# Patient Record
Sex: Female | Born: 1985 | Hispanic: Yes | Marital: Single | Smoking: Current every day smoker
Health system: Southern US, Community
[De-identification: ages and names within clinical notes are randomized; demographics above are authoritative.]

## PROBLEM LIST (undated history)

## (undated) DIAGNOSIS — I10 Essential (primary) hypertension: Secondary | ICD-10-CM

## (undated) DIAGNOSIS — E119 Type 2 diabetes mellitus without complications: Secondary | ICD-10-CM

---

## 2014-05-08 ENCOUNTER — Other Ambulatory Visit (HOSPITAL_COMMUNITY)
Admission: RE | Admit: 2014-05-08 | Discharge: 2014-05-08 | Disposition: A | Discharge status: Home / Self Care | Payer: Self-pay | Source: Ambulatory Visit | Attending: Unknown Physician Specialty | Admitting: Unknown Physician Specialty

## 2014-05-08 ENCOUNTER — Other Ambulatory Visit: Payer: Self-pay | Admitting: Nurse Practitioner

## 2014-05-08 DIAGNOSIS — R87612 Low grade squamous intraepithelial lesion on cytologic smear of cervix (LGSIL): Secondary | ICD-10-CM | POA: Insufficient documentation

## 2014-05-08 DIAGNOSIS — N871 Moderate cervical dysplasia: Secondary | ICD-10-CM | POA: Insufficient documentation

## 2014-05-14 LAB — CYTOLOGY - PAP

## 2014-11-22 ENCOUNTER — Ambulatory Visit: Payer: Self-pay | Admitting: Orthopedic Surgery

## 2016-05-25 ENCOUNTER — Encounter (HOSPITAL_COMMUNITY)
Admission: EM | Disposition: A | Discharge status: Home / Self Care | Payer: Self-pay | Source: Home / Self Care | Attending: Emergency Medicine

## 2016-05-25 ENCOUNTER — Observation Stay (HOSPITAL_COMMUNITY)
Admission: EM | Admit: 2016-05-25 | Discharge: 2016-05-26 | Disposition: A | Discharge status: Home / Self Care | Payer: Self-pay | Attending: Orthopaedic Surgery | Admitting: Orthopaedic Surgery

## 2016-05-25 ENCOUNTER — Emergency Department (HOSPITAL_COMMUNITY): Payer: Self-pay

## 2016-05-25 ENCOUNTER — Encounter (HOSPITAL_COMMUNITY): Payer: Self-pay | Admitting: Emergency Medicine

## 2016-05-25 DIAGNOSIS — W28XXXA Contact with powered lawn mower, initial encounter: Secondary | ICD-10-CM | POA: Insufficient documentation

## 2016-05-25 DIAGNOSIS — S92411B Displaced fracture of proximal phalanx of right great toe, initial encounter for open fracture: Principal | ICD-10-CM | POA: Insufficient documentation

## 2016-05-25 DIAGNOSIS — Z23 Encounter for immunization: Secondary | ICD-10-CM | POA: Insufficient documentation

## 2016-05-25 DIAGNOSIS — S91319A Laceration without foreign body, unspecified foot, initial encounter: Secondary | ICD-10-CM | POA: Diagnosis present

## 2016-05-25 DIAGNOSIS — Y93H2 Activity, gardening and landscaping: Secondary | ICD-10-CM | POA: Insufficient documentation

## 2016-05-25 DIAGNOSIS — S92421B Displaced fracture of distal phalanx of right great toe, initial encounter for open fracture: Secondary | ICD-10-CM | POA: Insufficient documentation

## 2016-05-25 DIAGNOSIS — S92911B Unspecified fracture of right toe(s), initial encounter for open fracture: Secondary | ICD-10-CM

## 2016-05-25 DIAGNOSIS — S91311A Laceration without foreign body, right foot, initial encounter: Secondary | ICD-10-CM

## 2016-05-25 DIAGNOSIS — E119 Type 2 diabetes mellitus without complications: Secondary | ICD-10-CM | POA: Insufficient documentation

## 2016-05-25 DIAGNOSIS — I10 Essential (primary) hypertension: Secondary | ICD-10-CM | POA: Insufficient documentation

## 2016-05-25 HISTORY — DX: Type 2 diabetes mellitus without complications: E11.9

## 2016-05-25 HISTORY — PX: I & D EXTREMITY: SHX5045

## 2016-05-25 HISTORY — DX: Essential (primary) hypertension: I10

## 2016-05-25 HISTORY — PX: IRRIGATION AND DEBRIDEMENT FOOT: SHX6602

## 2016-05-25 LAB — BASIC METABOLIC PANEL
ANION GAP: 8 (ref 5–15)
BUN: 8 mg/dL (ref 6–20)
CHLORIDE: 111 mmol/L (ref 101–111)
CO2: 19 mmol/L — AB (ref 22–32)
Calcium: 9.1 mg/dL (ref 8.9–10.3)
Creatinine, Ser: 0.49 mg/dL (ref 0.44–1.00)
GFR calc Af Amer: >60 mL/min (ref >60)
GLUCOSE: 121 mg/dL — AB (ref 65–99)
POTASSIUM: 3.8 mmol/L (ref 3.5–5.1)
Sodium: 138 mmol/L (ref 135–145)

## 2016-05-25 LAB — CBC WITH DIFFERENTIAL/PLATELET
BASOS ABS: 0 10*3/uL (ref 0.0–0.1)
BASOS PCT: 0 %
EOS ABS: 0 10*3/uL (ref 0.0–0.7)
EOS PCT: 0 %
HCT: 35.3 % — ABNORMAL LOW (ref 36.0–46.0)
HEMOGLOBIN: 12 g/dL (ref 12.0–15.0)
Lymphocytes Relative: 11 %
Lymphs Abs: 1 10*3/uL (ref 0.7–4.0)
MCH: 32.9 pg (ref 26.0–34.0)
MCHC: 34 g/dL (ref 30.0–36.0)
MCV: 96.7 fL (ref 78.0–100.0)
Monocytes Absolute: 0.6 10*3/uL (ref 0.1–1.0)
Monocytes Relative: 7 %
NEUTROS PCT: 82 %
Neutro Abs: 7.3 10*3/uL (ref 1.7–7.7)
PLATELETS: 192 10*3/uL (ref 150–400)
RBC: 3.65 MIL/uL — AB (ref 3.87–5.11)
RDW: 13.2 % (ref 11.5–15.5)
WBC: 8.9 10*3/uL (ref 4.0–10.5)

## 2016-05-25 LAB — CBG MONITORING, ED: Glucose-Capillary: 122 mg/dL — ABNORMAL HIGH (ref 65–99)

## 2016-05-25 LAB — I-STAT BETA HCG BLOOD, ED (MC, WL, AP ONLY): I-stat hCG, quantitative: <5 m[IU]/mL (ref <5)

## 2016-05-25 SURGERY — IRRIGATION AND DEBRIDEMENT EXTREMITY
Anesthesia: General | Site: Foot | Laterality: Right

## 2016-05-25 MED ORDER — FENTANYL CITRATE (PF) 100 MCG/2ML IJ SOLN
50.0000 ug | Freq: Once | INTRAMUSCULAR | Status: AC
  Administered 2016-05-25: 50 ug via INTRAVENOUS
  Filled 2016-05-25: qty 2

## 2016-05-25 MED ORDER — SODIUM CHLORIDE 0.9 % IJ SOLN
INTRAMUSCULAR | Status: AC
  Filled 2016-05-25: qty 10

## 2016-05-25 MED ORDER — SUCCINYLCHOLINE CHLORIDE 200 MG/10ML IV SOSY
PREFILLED_SYRINGE | INTRAVENOUS | Status: AC
  Filled 2016-05-25: qty 10

## 2016-05-25 MED ORDER — PROPOFOL 10 MG/ML IV BOLUS
INTRAVENOUS | Status: AC
  Filled 2016-05-25: qty 20

## 2016-05-25 MED ORDER — MIDAZOLAM HCL 2 MG/2ML IJ SOLN
INTRAMUSCULAR | Status: AC
  Filled 2016-05-25: qty 2

## 2016-05-25 MED ORDER — ROCURONIUM BROMIDE 10 MG/ML (PF) SYRINGE
PREFILLED_SYRINGE | INTRAVENOUS | Status: AC
Start: 2016-05-25 — End: 2016-05-25
  Filled 2016-05-25: qty 10

## 2016-05-25 MED ORDER — ONDANSETRON HCL 4 MG/2ML IJ SOLN
INTRAMUSCULAR | Status: AC
Start: 2016-05-25 — End: 2016-05-25
  Filled 2016-05-25: qty 2

## 2016-05-25 MED ORDER — LACTATED RINGERS IV SOLN
INTRAVENOUS | Status: DC | PRN
  Administered 2016-05-25 – 2016-05-26 (×2): via INTRAVENOUS

## 2016-05-25 MED ORDER — LIDOCAINE 2% (20 MG/ML) 5 ML SYRINGE
INTRAMUSCULAR | Status: AC
  Filled 2016-05-25: qty 5

## 2016-05-25 MED ORDER — TETANUS-DIPHTH-ACELL PERTUSSIS 5-2.5-18.5 LF-MCG/0.5 IM SUSP
0.5000 mL | Freq: Once | INTRAMUSCULAR | Status: AC
  Administered 2016-05-25: 0.5 mL via INTRAMUSCULAR
  Filled 2016-05-25: qty 0.5

## 2016-05-25 MED ORDER — SUFENTANIL CITRATE 50 MCG/ML IV SOLN
INTRAVENOUS | Status: AC
  Filled 2016-05-25: qty 1

## 2016-05-25 SURGICAL SUPPLY — 36 items
BAG DECANTER FOR FLEXI CONT (MISCELLANEOUS) IMPLANT
BANDAGE ACE 4X5 VEL STRL LF (GAUZE/BANDAGES/DRESSINGS) ×3 IMPLANT
BNDG COHESIVE 4X5 TAN STRL (GAUZE/BANDAGES/DRESSINGS) ×3 IMPLANT
BNDG GAUZE ELAST 4 BULKY (GAUZE/BANDAGES/DRESSINGS) ×3 IMPLANT
COVER SURGICAL LIGHT HANDLE (MISCELLANEOUS) ×3 IMPLANT
CUFF TOURNIQUET SINGLE 24IN (TOURNIQUET CUFF) ×3 IMPLANT
DRSG PAD ABDOMINAL 8X10 ST (GAUZE/BANDAGES/DRESSINGS) ×6 IMPLANT
ELECT REM PT RETURN 9FT ADLT (ELECTROSURGICAL)
ELECTRODE REM PT RTRN 9FT ADLT (ELECTROSURGICAL) IMPLANT
GAUZE SPONGE 4X4 12PLY STRL (GAUZE/BANDAGES/DRESSINGS) ×3 IMPLANT
GAUZE XEROFORM 5X9 LF (GAUZE/BANDAGES/DRESSINGS) ×3 IMPLANT
GLOVE BIOGEL PI IND STRL 8 (GLOVE) ×1 IMPLANT
GLOVE BIOGEL PI INDICATOR 8 (GLOVE) ×2
GLOVE ORTHO TXT STRL SZ7.5 (GLOVE) ×3 IMPLANT
GOWN STRL REUS W/ TWL LRG LVL3 (GOWN DISPOSABLE) ×1 IMPLANT
GOWN STRL REUS W/ TWL XL LVL3 (GOWN DISPOSABLE) ×1 IMPLANT
GOWN STRL REUS W/TWL 2XL LVL3 (GOWN DISPOSABLE) IMPLANT
GOWN STRL REUS W/TWL LRG LVL3 (GOWN DISPOSABLE) ×2
GOWN STRL REUS W/TWL XL LVL3 (GOWN DISPOSABLE) ×2
HANDPIECE INTERPULSE COAX TIP (DISPOSABLE)
KIT BASIN OR (CUSTOM PROCEDURE TRAY) ×3 IMPLANT
KIT ROOM TURNOVER OR (KITS) ×3 IMPLANT
MANIFOLD NEPTUNE II (INSTRUMENTS) ×3 IMPLANT
NS IRRIG 1000ML POUR BTL (IV SOLUTION) ×3 IMPLANT
PACK ORTHO EXTREMITY (CUSTOM PROCEDURE TRAY) ×3 IMPLANT
PAD ARMBOARD 7.5X6 YLW CONV (MISCELLANEOUS) ×6 IMPLANT
SET HNDPC FAN SPRY TIP SCT (DISPOSABLE) IMPLANT
SPONGE GAUZE 4X4 12PLY STER LF (GAUZE/BANDAGES/DRESSINGS) ×3 IMPLANT
SPONGE LAP 18X18 X RAY DECT (DISPOSABLE) ×3 IMPLANT
SUT ETHILON 2 0 FS 18 (SUTURE) ×6 IMPLANT
TOWEL OR 17X24 6PK STRL BLUE (TOWEL DISPOSABLE) ×3 IMPLANT
TOWEL OR 17X26 10 PK STRL BLUE (TOWEL DISPOSABLE) ×3 IMPLANT
TUBE CONNECTING 12'X1/4 (SUCTIONS) ×1
TUBE CONNECTING 12X1/4 (SUCTIONS) ×2 IMPLANT
UNDERPAD 30X30 (UNDERPADS AND DIAPERS) ×3 IMPLANT
YANKAUER SUCT BULB TIP NO VENT (SUCTIONS) ×3 IMPLANT

## 2016-05-25 NOTE — ED Notes (Signed)
Pt to OR at this time.

## 2016-05-25 NOTE — ED Triage Notes (Signed)
In EMS after lawnmower vs foot. Pt has deep laceration to RLE. Pulses present, sensation intact. Given 100mcg fentanyl, 4mg  zofran, 1g ancef.

## 2016-05-25 NOTE — Interval H&P Note (Signed)
History and Physical Interval Note:  05/25/2016 11:36 PM  Renee Cohen  has presented today for surgery, with the diagnosis of Right foot trauma  The various methods of treatment have been discussed with the patient and family. After consideration of risks, benefits and other options for treatment, the patient has consented to  Procedure(s): IRRIGATION AND DEBRIDEMENT EXTREMITY (Right) as a surgical intervention .  The patient's history has been reviewed, patient examined, no change in status, stable for surgery.  I have reviewed the patient's chart and labs.  Questions were answered to the patient's satisfaction.     Eldred MangesMark C Maanasa Aderhold

## 2016-05-25 NOTE — H&P (Signed)
Renee Cohen is an 30 y.o. female.   Chief Complaint: barefoot right foot lawnmower injury fx's great toe HPI: past Hx of foot injury age 47 with skin lacerations. On riding lawnmower turning and foot slipped off.  Past Medical History:  Diagnosis Date  . Diabetes mellitus without complication (HCC)   . Hypertension     History reviewed. No pertinent surgical history.  No family history on file. Social History:  has no tobacco, alcohol, and drug history on file.  Allergies:  Allergies  Allergen Reactions  . Oxycodone Itching and Rash     (Not in a hospital admission)  Results for orders placed or performed during the hospital encounter of 05/25/16 (from the past 48 hour(s))  I-Stat Beta hCG blood, ED ([facility], [facility], [facility] only)     Status: None   Collection Time: 05/25/16  9:38 PM  Result Value Ref Range   I-stat hCG, quantitative <5.0 <5 mIU/mL   Comment 3            Comment:   GEST. AGE      CONC.  (mIU/mL)   <=1 WEEK        5 - 50     2 WEEKS       50 - 500     3 WEEKS       100 - 10,000     4 WEEKS     1,000 - 30,000        FEMALE AND NON-PREGNANT FEMALE:     LESS THAN 5 mIU/mL    Dg Foot Complete Right  Result Date: 05/25/2016 CLINICAL DATA:  Right foot slipped and hit blades while mowing lawn. Initial encounter. EXAM: RIGHT FOOT COMPLETE - 3+ VIEW COMPARISON:  None. FINDINGS: There appear to be minimally displaced fractures involving the medial base of the first distal phalanx, and the proximal aspect of the first proximal phalanx, with overlying soft tissue disruption. Minimal scattered high-density debris is noted tracking about the patient's soft tissue wound. Visualized joint spaces are otherwise preserved. The subtalar joint is grossly unremarkable. IMPRESSION: Minimally displaced fractures involving the medial base of the first distal phalanx, and the proximal aspect of the first proximal phalanx. Minimal high-density debris noted tracking about the patient's soft  tissue wound. Electronically Signed   By: Roanna Raider M.D.   On: 05/25/2016 23:09    Review of Systems  Constitutional: Negative.   HENT: Negative.   Respiratory:       Smokes one half pack per day  Skin: Negative.   Endo/Heme/Allergies:       Borderline diabetes    Blood pressure (!) 117/42, pulse 81, temperature 97.7 F (36.5 C), temperature source Oral, resp. rate 22, height 5\' 7"  (1.702 m), weight 47.6 kg (105 lb), SpO2 100 %. Physical Exam  Constitutional: She is oriented to person, place, and time. She appears well-developed and well-nourished.  HENT:  Head: Normocephalic.  Eyes: Pupils are equal, round, and reactive to light.  Neck: Normal range of motion.  Cardiovascular: Normal rate and regular rhythm.   Respiratory: Effort normal.  GI: Soft.  Musculoskeletal:  7cm plantar laceration cannot flex great toe ? Pain good cap refill to great toe.has sensation to both sides of great toe  Neurological: She is alert and oriented to person, place, and time.  Psychiatric: She has a normal mood and affect. Her behavior is normal.     Assessment/Plan Right foot dirty laceration with dirt and grass ground in subQ exposed  and some plantar fascia, possible tendon laceration. To OR for exploration , wash out and repair as needed  Eldred MangesMark C Saloma Cadena, MD 05/25/2016, 11:24 PM

## 2016-05-25 NOTE — ED Notes (Signed)
OR consent signed after procedure explained to pt by Dr. Ophelia CharterYates.

## 2016-05-25 NOTE — ED Notes (Signed)
Dr. Ophelia CharterYates at bedside at this time.  All clothes removed and pt placed in gown.  Shirt, Bra, Shorts, Black [employer] architectcell Phone and charger given to pt's friend.

## 2016-05-25 NOTE — ED Provider Notes (Signed)
[facility]ENCY DEPT Provider Note   CSN: [id number] Arrival date & time: 05/25/16  2028     History   Chief Complaint Chief Complaint  Patient presents with  . Foot Injury    HPI Renee Cohen is a 30 y.o. female.  HPI Patient presents with injury to the right foot after she was struck by a lawnmower blade. Unknown last tetanus. Given Ancef 1 g and fentanyl in route. Patient denies any numbness to the foot. Denies any other injury. Per EMS wound was grossly contaminated. Dressing applied. Past Medical History:  Diagnosis Date  . Diabetes mellitus without complication (HCC)   . Hypertension     There are no active problems to display for this patient.   History reviewed. No pertinent surgical history.  OB History    No data available       Home Medications    Prior to Admission medications   Not on File    Family History No family history on file.  Social History Social History  Substance Use Topics  . Smoking status: Not on file  . Smokeless tobacco: Not on file  . Alcohol use Not on file     Allergies   Review of patient's allergies indicates no known allergies.   Review of Systems Review of Systems  Constitutional: Negative for chills and fever.  Respiratory: Negative for shortness of breath.   Cardiovascular: Negative for chest pain.  Gastrointestinal: Negative for abdominal pain, nausea and vomiting.  Musculoskeletal: Positive for arthralgias. Negative for back pain and neck pain.  Neurological: Negative for dizziness, weakness, numbness and headaches.  All other systems reviewed and are negative.    Physical Exam Updated Vital Signs BP (!) 117/42 (BP Location: Right Arm)   Pulse 81   Temp 97.7 F (36.5 C) (Oral)   Resp 22   Ht 5\' 7"  (1.702 m)   Wt 105 lb (47.6 kg)   LMP  (LMP Unknown)   SpO2 100%   BMI 16.45 kg/m   Physical Exam  Constitutional: She is oriented to person, place, and time. She appears well-developed and  well-nourished.  HENT:  Head: Normocephalic and atraumatic.  Mouth/Throat: Oropharynx is clear and moist.  Eyes: EOM are normal. Pupils are equal, round, and reactive to light.  Neck: Normal range of motion. Neck supple.  Cardiovascular: Normal rate and regular rhythm.   Pulmonary/Chest: Effort normal and breath sounds normal.  Abdominal: Soft. Bowel sounds are normal. There is no tenderness. There is no rebound and no guarding.  Musculoskeletal: Normal range of motion. She exhibits tenderness. She exhibits no edema.  Patient with a 8 cm laceration to the plantar surface of the right foot at the level of the first MTP joint. There is no active bleeding. Grossly contaminated with grass. 2+ dorsalis pedis and posterior tibial pulses. Good distal cap refill.  Neurological: She is alert and oriented to person, place, and time.  Distal sensation to the toes is intact in the right foot. Patient is moving the toes though difficult to assess due to pain.  Skin: Skin is warm and dry. Capillary refill takes less than 2 seconds. No rash noted. No erythema.  Psychiatric: She has a normal mood and affect. Her behavior is normal.  Nursing note and vitals reviewed.    ED Treatments / Results  Labs (all labs ordered are listed, but only abnormal results are displayed) Labs Reviewed  BASIC METABOLIC PANEL  CBC WITH DIFFERENTIAL/PLATELET  I-STAT BETA HCG BLOOD, ED ([facility],  [facility], [facility] ONLY)  CBG MONITORING, ED    EKG  EKG Interpretation None       Radiology No results found.  Procedures Procedures (including critical care time)  Medications Ordered in ED Medications  fentaNYL (SUBLIMAZE) injection 50 mcg (50 mcg Intravenous Given 05/25/16 2101)  Tdap (BOOSTRIX) injection 0.5 mL (0.5 mLs Intramuscular Given 05/25/16 2101)     Initial Impression / Assessment and Plan / ED Course  I have reviewed the triage vital signs and the nursing notes.  Pertinent labs & imaging results that were available  during my care of the patient were reviewed by me and considered in my medical decision making (see chart for details).  Clinical Course   Dressing was placed. Tetanus was updated. Patient received Ancef in route. There is a distal phalanx fracture of the first toe of the right foot. The fracture involves the DIP joint. Concern for open fracture. Given the amount of debris in the patient's wound, will likely need a wire washout. Discussed with Dr. Ophelia Charter and we'll arrange to take patient to the OR. Patient is unsure of her past history of diabetes. She is currently taking no medications for this. We'll check basic labs and CBG.   Final Clinical Impressions(s) / ED Diagnoses   Final diagnoses:  Foot laceration, right, initial encounter  Phalanx fracture, foot, right, open, initial encounter    New Prescriptions New Prescriptions   No medications on file     Loren Racer, MD 05/25/16 2300

## 2016-05-26 ENCOUNTER — Encounter (HOSPITAL_COMMUNITY): Payer: Self-pay | Admitting: General Practice

## 2016-05-26 ENCOUNTER — Emergency Department (HOSPITAL_COMMUNITY): Payer: Self-pay | Admitting: Anesthesiology

## 2016-05-26 DIAGNOSIS — S91319A Laceration without foreign body, unspecified foot, initial encounter: Secondary | ICD-10-CM | POA: Diagnosis present

## 2016-05-26 LAB — GLUCOSE, CAPILLARY: GLUCOSE-CAPILLARY: 108 mg/dL — AB (ref 65–99)

## 2016-05-26 MED ORDER — DIPHENHYDRAMINE HCL 50 MG/ML IJ SOLN
12.5000 mg | Freq: Once | INTRAMUSCULAR | Status: AC
  Administered 2016-05-26: 12.5 mg via INTRAVENOUS

## 2016-05-26 MED ORDER — PROPOFOL 10 MG/ML IV BOLUS
INTRAVENOUS | Status: AC
  Filled 2016-05-26: qty 20

## 2016-05-26 MED ORDER — HYDROMORPHONE HCL 1 MG/ML IJ SOLN
0.2500 mg | INTRAMUSCULAR | Status: DC | PRN
  Administered 2016-05-26 (×2): 0.5 mg via INTRAVENOUS

## 2016-05-26 MED ORDER — DEXAMETHASONE SODIUM PHOSPHATE 4 MG/ML IJ SOLN
INTRAMUSCULAR | Status: DC | PRN
  Administered 2016-05-26: 4 mg via INTRAVENOUS

## 2016-05-26 MED ORDER — ONDANSETRON HCL 4 MG/2ML IJ SOLN
4.0000 mg | Freq: Four times a day (QID) | INTRAMUSCULAR | Status: AC | PRN
  Administered 2016-05-26: 4 mg via INTRAVENOUS

## 2016-05-26 MED ORDER — ACETAMINOPHEN 325 MG PO TABS: 650.0000 mg | ORAL_TABLET | Freq: Four times a day (QID) | ORAL | Status: DC | PRN

## 2016-05-26 MED ORDER — METOCLOPRAMIDE HCL 5 MG/ML IJ SOLN: 5.0000 mg | Freq: Three times a day (TID) | INTRAMUSCULAR | Status: DC | PRN

## 2016-05-26 MED ORDER — SODIUM CHLORIDE 0.45 % IV SOLN
INTRAVENOUS | Status: DC
  Administered 2016-05-26: 03:00:00 via INTRAVENOUS

## 2016-05-26 MED ORDER — HYDROMORPHONE HCL 1 MG/ML IJ SOLN
INTRAMUSCULAR | Status: AC
  Filled 2016-05-26: qty 1

## 2016-05-26 MED ORDER — HYDROMORPHONE HCL 1 MG/ML IJ SOLN
0.5000 mg | INTRAMUSCULAR | Status: DC | PRN
  Administered 2016-05-26: 0.5 mg via INTRAVENOUS
  Filled 2016-05-26: qty 1

## 2016-05-26 MED ORDER — DIPHENHYDRAMINE HCL 50 MG/ML IJ SOLN
INTRAMUSCULAR | Status: AC
  Filled 2016-05-26: qty 1

## 2016-05-26 MED ORDER — MIDAZOLAM HCL 2 MG/2ML IJ SOLN
INTRAMUSCULAR | Status: DC | PRN
  Administered 2016-05-26: 2 mg via INTRAVENOUS

## 2016-05-26 MED ORDER — ONDANSETRON HCL 4 MG PO TABS: 4.0000 mg | ORAL_TABLET | Freq: Four times a day (QID) | ORAL | Status: DC | PRN

## 2016-05-26 MED ORDER — PROPOFOL 10 MG/ML IV BOLUS
INTRAVENOUS | Status: DC | PRN
  Administered 2016-05-26: 120 mg via INTRAVENOUS

## 2016-05-26 MED ORDER — SUFENTANIL CITRATE 50 MCG/ML IV SOLN
INTRAVENOUS | Status: DC | PRN
  Administered 2016-05-26: 5 ug via INTRAVENOUS
  Administered 2016-05-26: 10 ug via INTRAVENOUS

## 2016-05-26 MED ORDER — ACETAMINOPHEN 650 MG RE SUPP: 650.0000 mg | Freq: Four times a day (QID) | RECTAL | Status: DC | PRN

## 2016-05-26 MED ORDER — CELECOXIB 200 MG PO CAPS
200.0000 mg | ORAL_CAPSULE | Freq: Two times a day (BID) | ORAL | Status: DC
  Administered 2016-05-26 (×2): 200 mg via ORAL
  Filled 2016-05-26 (×3): qty 1

## 2016-05-26 MED ORDER — CEFAZOLIN SODIUM-DEXTROSE 2-4 GM/100ML-% IV SOLN
2.0000 g | Freq: Four times a day (QID) | INTRAVENOUS | Status: AC
  Administered 2016-05-26 (×3): 2 g via INTRAVENOUS
  Filled 2016-05-26 (×4): qty 100

## 2016-05-26 MED ORDER — SODIUM CHLORIDE 0.9 % IR SOLN
Status: DC | PRN
  Administered 2016-05-26: 1000 mL

## 2016-05-26 MED ORDER — NICOTINE 14 MG/24HR TD PT24
14.0000 mg | MEDICATED_PATCH | Freq: Every day | TRANSDERMAL | Status: DC
  Administered 2016-05-26: 14 mg via TRANSDERMAL
  Filled 2016-05-26: qty 1

## 2016-05-26 MED ORDER — ONDANSETRON HCL 4 MG/2ML IJ SOLN
INTRAMUSCULAR | Status: DC | PRN
  Administered 2016-05-26: 4 mg via INTRAVENOUS

## 2016-05-26 MED ORDER — LIDOCAINE HCL (CARDIAC) 20 MG/ML IV SOLN
INTRAVENOUS | Status: DC | PRN
  Administered 2016-05-26: 60 mg via INTRATRACHEAL

## 2016-05-26 MED ORDER — CEFAZOLIN IN D5W 1 GM/50ML IV SOLN
INTRAVENOUS | Status: DC | PRN
  Administered 2016-05-26: 1 g via INTRAVENOUS

## 2016-05-26 MED ORDER — ONDANSETRON HCL 4 MG/2ML IJ SOLN: 4.0000 mg | Freq: Four times a day (QID) | INTRAMUSCULAR | Status: DC | PRN

## 2016-05-26 MED ORDER — KETOROLAC TROMETHAMINE 15 MG/ML IJ SOLN
15.0000 mg | Freq: Four times a day (QID) | INTRAMUSCULAR | Status: DC
  Administered 2016-05-26 (×3): 15 mg via INTRAVENOUS
  Filled 2016-05-26 (×3): qty 1

## 2016-05-26 MED ORDER — HYDROCODONE-ACETAMINOPHEN 7.5-325 MG PO TABS
1.0000 | ORAL_TABLET | ORAL | Status: DC | PRN
  Administered 2016-05-26 (×2): 1 via ORAL
  Administered 2016-05-26: 2 via ORAL
  Filled 2016-05-26: qty 1
  Filled 2016-05-26: qty 2
  Filled 2016-05-26: qty 1

## 2016-05-26 MED ORDER — ONDANSETRON HCL 4 MG/2ML IJ SOLN
INTRAMUSCULAR | Status: AC
  Filled 2016-05-26: qty 2

## 2016-05-26 MED ORDER — METOCLOPRAMIDE HCL 5 MG PO TABS: 5.0000 mg | ORAL_TABLET | Freq: Three times a day (TID) | ORAL | Status: DC | PRN

## 2016-05-26 MED ORDER — SUCCINYLCHOLINE CHLORIDE 20 MG/ML IJ SOLN
INTRAMUSCULAR | Status: DC | PRN
  Administered 2016-05-26: 100 mg via INTRAVENOUS

## 2016-05-26 MED ORDER — DEXAMETHASONE SODIUM PHOSPHATE 10 MG/ML IJ SOLN
INTRAMUSCULAR | Status: AC
  Filled 2016-05-26: qty 1

## 2016-05-26 NOTE — Progress Notes (Signed)
Spoke with Dr. Roda ShuttersXu concerning patient's discharge; no follow-up appointment entered and no pain medications entered for discharge. Xu stated patient can either wait until morning for Dr. Ophelia CharterYates to receive pain medication order for discharge or be discharged this evening and go to the orthopedic office tomorrow morning to obtain pain medication prescription. Explained to patient this situation. Patient agreed to leave tonight and go to orthopedic office tomorrow. Reviewed discharge instructions with patient and gave patient address for Dr. Ophelia CharterYates office, as well as phone number to office for contact.

## 2016-05-26 NOTE — Anesthesia Preprocedure Evaluation (Signed)
Anesthesia Evaluation  Patient identified by MRN, date of birth, ID band Patient awake    Reviewed: Allergy & Precautions, NPO status , Patient's Chart, lab work & pertinent test results  Airway Mallampati: II   Neck ROM: full    Dental   Pulmonary neg pulmonary ROS,  breath sounds clear to auscultation        Cardiovascular hypertension, Rhythm:regular Rate:Normal     Neuro/Psych    GI/Hepatic   Endo/Other  diabetes, Type 2  Renal/GU      Musculoskeletal   Abdominal   Peds  Hematology   Anesthesia Other Findings   Reproductive/Obstetrics                             Anesthesia Physical Anesthesia Plan  ASA: II  Anesthesia Plan: General   Post-op Pain Management:    Induction: Intravenous  Airway Management Planned: LMA  Additional Equipment:   Intra-op Plan:   Post-operative Plan:   Informed Consent: I have reviewed the patients History and Physical, chart, labs and discussed the procedure including the risks, benefits and alternatives for the proposed anesthesia with the patient or authorized representative who has indicated his/her understanding and acceptance.     Plan Discussed with: CRNA, Anesthesiologist and Surgeon  Anesthesia Plan Comments:         Anesthesia Quick Evaluation  

## 2016-05-26 NOTE — Anesthesia Procedure Notes (Signed)
Procedure Name: Intubation Date/Time: 05/26/2016 12:12 AM Performed by: Chima Astorino S Pre-anesthesia Checklist: Patient identified, Emergency Drugs available, Suction available, Patient being monitored and Timeout performed Patient Re-evaluated:Patient Re-evaluated prior to inductionOxygen Delivery Method: Circle system utilized Preoxygenation: Pre-oxygenation with 100% oxygen Intubation Type: IV induction and Rapid sequence Ventilation: Mask ventilation without difficulty Laryngoscope Size: Mac and 3 Grade View: Grade I Tube type: Oral Tube size: 7.5 mm Number of attempts: 1 Airway Equipment and Method: Stylet Placement Confirmation: ETT inserted through vocal cords under direct vision,  positive ETCO2 and breath sounds checked- equal and bilateral Secured at: 22 cm Tube secured with: Tape Dental Injury: Teeth and Oropharynx as per pre-operative assessment

## 2016-05-26 NOTE — Transfer of Care (Signed)
Immediate Anesthesia Transfer of Care Note  Patient: Shaune PollackSandra Lagos Mendoza  Procedure(s) Performed: Procedure(s): IRRIGATION AND DEBRIDEMENT EXTREMITY, Closure of wound (Right)  Patient Location: PACU  Anesthesia Type:General  Level of Consciousness: awake  Airway & Oxygen Therapy: Patient Spontanous Breathing and Patient connected to face mask oxygen  Post-op Assessment: Report given to RN and Post -op Vital signs reviewed and stable  Post vital signs: Reviewed and stable  Last Vitals:  Vitals:   05/25/16 2330 05/26/16 0102  BP: 106/69 (!) 86/40  Pulse:  90  Resp:  11  Temp:  36.5 C    Last Pain:  Vitals:   05/25/16 2056  TempSrc: Oral  PainSc:          Complications: No apparent anesthesia complications

## 2016-05-26 NOTE — Progress Notes (Signed)
Shaune PollackSandra Lagos Mendoza to be D/C'd  per MD order. Discussed with the patient and all questions fully answered.  VSS, Skin clean, dry and intact without evidence of skin break down, no evidence of skin tears noted.  IV catheter discontinued intact. Site without signs and symptoms of complications. Dressing and pressure applied.  An After Visit Summary was printed and given to the patient.  D/c education completed with patient/family including follow up instructions, medication list, d/c activities limitations if indicated, with other d/c instructions as indicated by MD - patient able to verbalize understanding, all questions fully answered.   Patient instructed to return to ED, call 911, or call MD for any changes in condition.   Patient to be escorted via WC, and D/C home via private auto.

## 2016-05-26 NOTE — Progress Notes (Signed)
Subjective: 1 Day Post-Op Procedure(s) (LRB): IRRIGATION AND DEBRIDEMENT EXTREMITY, Closure of wound (Right) Patient reports pain as 4 on 0-10 scale.    Objective: Vital signs in last 24 hours: Temp:  [97.7 F (36.5 C)-98.3 F (36.8 C)] 98.1 F (36.7 C) (09/26 0525) Pulse Rate:  [42-120] 48 (09/26 0525) Resp:  [11-33] 28 (09/26 0200) BP: (86-117)/(40-69) 90/50 (09/26 0525) SpO2:  [95 %-100 %] 100 % (09/26 0525) Weight:  [47.6 kg (105 lb)-50 kg (110 lb 3.7 oz)] 50 kg (110 lb 3.7 oz) (09/26 0214)  Intake/Output from previous day: 09/25 0701 - 09/26 0700 In: 1450 [I.V.:1400; IV Piggyback:50] Out: 450 [Urine:400; Blood:50] Intake/Output this shift: No intake/output data recorded.   Recent Labs  05/25/16 2316  HGB 12.0    Recent Labs  05/25/16 2316  WBC 8.9  RBC 3.65*  HCT 35.3*  PLT 192    Recent Labs  05/25/16 2316  NA 138  K 3.8  CL 111  CO2 19*  BUN 8  CREATININE 0.49  GLUCOSE 121*  CALCIUM 9.1   No results for input(s): LABPT, INR in the last 72 hours.  dressing dry. OOB to bedside commode.   Assessment/Plan: 1 Day Post-Op Procedure(s) (LRB): IRRIGATION AND DEBRIDEMENT EXTREMITY, Closure of wound (Right) Up with therapy home if does OK with crutches.   Eldred MangesMark C Rima Blizzard 05/26/2016, 8:10 AM

## 2016-05-26 NOTE — Progress Notes (Signed)
Orthopedic Tech Progress Note Patient Details:  Renee Cohen 06/15/1986 [id number]  Patient ID: Renee Cohen, female   DOB: 12/17/1985, 30 y.o.   MRN: [id number]   Renee Cohen, Renee Cohen 05/26/2016, 9:07 AM Viewed order from doctor's order list

## 2016-05-26 NOTE — Op Note (Signed)
Renee Cohen:  Renee Cohen, Renee Cohen        ACCOUNT NO.:  [id number]  MEDICAL RECORD NO.:  [id number]  LOCATION:  [id number]                        FACILITY:  [facility]  PHYSICIAN:  Roxi Hlavaty C. Ophelia CharterYates, M.D.    DATE OF BIRTH:  1986-01-20  DATE OF PROCEDURE:  05/25/2016 DATE OF DISCHARGE:                              OPERATIVE REPORT   PREOPERATIVE DIAGNOSIS:  Lawn mower injury, right foot with great toe proximal and distal phalanx fractures.  POSTOPERATIVE DIAGNOSIS:  Lawn mower injury, right foot with great toe proximal and distal phalanx fractures.  PROCEDURE:  Sharp excisional debridement of 7-cm laceration and closure (sharp excisional debridement of skin, subcutaneous tissue and capsule).  TOURNIQUET TIME:  Less than an hour.  HISTORY OF PRESENT ILLNESS:  A 30 year old female, riding lawn mower, went around corner quickly, had to put her foot down and got her foot in contact with lawn mower blade with laceration, right plantar medial foot.  X-rays demonstrated nondisplaced fractures of the proximal phalanx and distal phalanx.  DESCRIPTION OF PROCEDURE:  After standard prepping and draping, time-out procedure was completed.  Proximal thigh tourniquet has been applied, extremity sheets and drapes.  Thirty minutes was spent picking pieces of grass and dirt out of the wound.  Sharp excisional debridement with pickups and scalpel using Therapist, nutritionalreer elevator, scraping, repeat irrigation with liter of saline, then repeat sharp excisional debridement again and then again irrigating.  The laceration was extended down to the capsule, portion of the capsule was removed.  The sesamoids were intact, not visualized, still covered with capsule.  Flexor tendon was active and not damaged.  The patient had skin laceration along the medial aspect of the IP joint, but this was epidermis only with intact dermis.  Skin edges for the 7 cm T-shaped laceration along the medial aspect of the foot over the metatarsophalangeal  joint was sharply, excisionally debrided and then interrupted 2-0 nylon sutures were used to approximate the edges.  There was still slight gap.  Some portion of this skin had epidermis peeled off.  Xeroform, 4x4s and Ace wrap were applied for postoperative dressing.  The patient received Ancef both in the ER as well at the time of operative debridement in the holding area and run at the beginning of surgery prior to tourniquet inflation.     Jolyne Laye C. Ophelia CharterYates, M.D.     [facility]  D:  05/26/2016  T:  05/26/2016  Job:  [id number]

## 2016-05-26 NOTE — Brief Op Note (Signed)
05/25/2016 - 05/26/2016  12:54 AM  PATIENT:  Renee Cohen  30 y.o. female  PRE-OPERATIVE DIAGNOSIS:  Right foot trauma  POST-OPERATIVE DIAGNOSIS:  Right foot trauma  PROCEDURE:  Procedure(s): IRRIGATION AND DEBRIDEMENT EXTREMITY, Closure of wound (Right)  SURGEON:  Surgeon(s) and Role:    * Eldred MangesMark C Yates, MD - Primary  PHYSICIAN ASSISTANT:   ASSISTANTS: none   ANESTHESIA:   general  EBL:  Total I/O In: 1000 [I.V.:1000] Out: -   BLOOD ADMINISTERED:none  DRAINS: none   LOCAL MEDICATIONS USED:  NONE  SPECIMEN:  No Specimen  DISPOSITION OF SPECIMEN:  N/A  COUNTS:  YES  TOURNIQUET:   Total Tourniquet Time Documented: Thigh (Right) - 22 minutes Total: Thigh (Right) - 22 minutes   DICTATION: .Other Dictation: Dictation Number [id number]  PLAN OF CARE: Admit to inpatient   PATIENT DISPOSITION:  PACU - hemodynamically stable.   Delay start of Pharmacological VTE agent (>24hrs) due to surgical blood loss or risk of bleeding: not applicable

## 2016-05-26 NOTE — Progress Notes (Signed)
Orthopedic Tech Progress Note Patient Details:  Renee PollackSandra Lagos Mendoza 12/10/1985 [id number]  Ortho Devices Type of Ortho Device: Crutches, Postop shoe/boot Ortho Device/Splint Location: rle Ortho Device/Splint Interventions: Application   Moussa Wiegand 05/26/2016, 9:07 AM

## 2016-05-26 NOTE — Evaluation (Signed)
Physical Therapy Evaluation Patient Details Name: Renee Cohen MRN: [id number] DOB: [birth year] Today's Date: 05/26/2016   History of Present Illness  Pt is a 30 y.o. female admitted following a lawn mower accident resulting in Rt great toe fracture. Pt now s/p I&D on 05/25/16. PMH: HTN, diabetes.   Clinical Impression  Pt mobilizing well during initial PT session, able to ambulate 100 ft with crutches and up/down 3 stairs. Pt reports that she feel confident with her mobility (including stairs) and she is hoping to return home as soon as possible. Pt consistent throughout session regarding NWB status. Patient denies any questions or concerns following session. PT to continue to follow acutely.     Follow Up Recommendations No PT follow up (pt declines HHPT services)    Equipment Recommendations  Crutches;3in1 (PT)    Recommendations for Other Services       Precautions / Restrictions Precautions Required Braces or Orthoses: Other Brace/Splint Other Brace/Splint: post-op shoe Restrictions Weight Bearing Restrictions: Yes RLE Weight Bearing: Non weight bearing      Mobility  Bed Mobility Overal bed mobility: Independent                Transfers Overall transfer level: Modified independent Equipment used: Crutches             General transfer comment: reviewed safety with crutches, performed from bed, chair and toilet.   Ambulation/Gait Ambulation/Gait assistance: Modified independent (Device/Increase time) Ambulation Distance (Feet): 100 Feet Assistive device: Crutches Gait Pattern/deviations:  (swing-to pattern) Gait velocity: decreased   General Gait Details: 2 loss of balance with independent recovery, discussed decreasing strides with crutches which improved stability.   Stairs Stairs: Yes Stairs assistance: Min guard Stair Management: One rail Left;With crutches;Forwards Number of Stairs: 3 General stair comments: After completing 3 stairs, pt  reports feeling confident and she declined attempting any more steps. Discussed technique to hold crutches while using rail.    Wheelchair Mobility    Modified Rankin (Stroke Patients Only)       Balance Overall balance assessment: Needs assistance Sitting-balance support: No upper extremity supported Sitting balance-Leahy Scale: Normal     Standing balance support: Single extremity supported Standing balance-Leahy Scale: Fair                               Pertinent Vitals/Pain Pain Assessment: No/denies pain    Home Living Family/patient expects to be discharged to:: Private residence Living Arrangements: Children Available Help at Discharge:  (limited family assistance) Type of Home: House Home Access: Level entry     Home Layout: Two level (bedroom upstairs, bathroom downstairs) Home Equipment: None Additional Comments: Has 3 children under age of 54.     Prior Function Level of Independence: Independent               Hand Dominance        Extremity/Trunk Assessment   Upper Extremity Assessment: Overall WFL for tasks assessed           Lower Extremity Assessment: Overall WFL for tasks assessed         Communication   Communication: No difficulties  Cognition Arousal/Alertness: Awake/alert Behavior During Therapy: WFL for tasks assessed/performed Overall Cognitive Status: Within Functional Limits for tasks assessed                      General Comments      Exercises  Assessment/Plan    PT Assessment Patient needs continued PT services  PT Problem List Decreased strength;Decreased range of motion;Decreased activity tolerance;Decreased balance;Decreased mobility          PT Treatment Interventions DME instruction;Gait training;Stair training;Functional mobility training;Therapeutic activities;Therapeutic exercise    PT Goals (Current goals can be found in the Care Plan section)  Acute Rehab PT Goals Patient  Stated Goal: get home PT Goal Formulation: With patient Time For Goal Achievement: 06/02/16 Potential to Achieve Goals: Good    Frequency Min 3X/week   Barriers to discharge        Co-evaluation               End of Session Equipment Utilized During Treatment: Gait belt Activity Tolerance: Patient tolerated treatment well Patient left: in chair;with call bell/phone within reach Nurse Communication: Mobility status    Functional Assessment Tool Used: clinical judgment Functional Limitation: Mobility: Walking and moving around Mobility: Walking and Moving Around Current Status [phone](G8978): At least 1 percent but less than 20 percent impaired, limited or restricted Mobility: Walking and Moving Around Goal Status [phone](G8979): At least 1 percent but less than 20 percent impaired, limited or restricted    Time: 1007-1047 PT Time Calculation (min) (ACUTE ONLY): 40 min   Charges:   PT Evaluation $PT Eval Moderate Complexity: 1 Procedure PT Treatments $Gait Training: 23-37 mins   PT G Codes:   PT G-Codes **NOT FOR INPATIENT CLASS** Functional Assessment Tool Used: clinical judgment Functional Limitation: Mobility: Walking and moving around Mobility: Walking and Moving Around Current Status ([phone]): At least 1 percent but less than 20 percent impaired, limited or restricted Mobility: Walking and Moving Around Goal Status [phone](G8979): At least 1 percent but less than 20 percent impaired, limited or restricted    Christiane HaBenjamin J. Pavle Wiler, PT, CSCS Pager [phone] Office [phone]  05/26/2016, 11:02 AM

## 2016-05-27 NOTE — Anesthesia Postprocedure Evaluation (Signed)
Anesthesia Post Note  Patient: Renee PollackSandra Lagos Cohen  Procedure(s) Performed: Procedure(s) (LRB): IRRIGATION AND DEBRIDEMENT EXTREMITY, Closure of wound (Right)  Patient location during evaluation: PACU Anesthesia Type: General Level of consciousness: awake and alert and patient cooperative Pain management: pain level controlled Vital Signs Assessment: post-procedure vital signs reviewed and stable Respiratory status: spontaneous breathing and respiratory function stable Cardiovascular status: stable Anesthetic complications: no    Last Vitals:  Vitals:   05/26/16 1326 05/26/16 1833  BP: (!) 103/51 99/77  Pulse: (!) 51 61  Resp:    Temp: 37.1 C 37.1 C    Last Pain:  Vitals:   05/26/16 1900  TempSrc:   PainSc: 2                  Rikia Sukhu S

## 2016-06-02 ENCOUNTER — Inpatient Hospital Stay (INDEPENDENT_AMBULATORY_CARE_PROVIDER_SITE_OTHER): Payer: Self-pay | Admitting: Orthopaedic Surgery

## 2016-06-02 DIAGNOSIS — S92411D Displaced fracture of proximal phalanx of right great toe, subsequent encounter for fracture with routine healing: Secondary | ICD-10-CM

## 2016-06-02 DIAGNOSIS — S91321D Laceration with foreign body, right foot, subsequent encounter: Secondary | ICD-10-CM

## 2016-06-02 DIAGNOSIS — S92421D Displaced fracture of distal phalanx of right great toe, subsequent encounter for fracture with routine healing: Secondary | ICD-10-CM

## 2016-06-09 ENCOUNTER — Inpatient Hospital Stay (INDEPENDENT_AMBULATORY_CARE_PROVIDER_SITE_OTHER): Payer: Self-pay | Admitting: Orthopaedic Surgery

## 2016-06-18 NOTE — Discharge Summary (Signed)
Patient ID: Renee PollackSandra Lagos Cohen MRN: [id number] DOB/AGE: 28/01/1986 28 y.o.  Admit date: 05/25/2016 Discharge date: 06/18/2016  Admission Diagnoses:  Active Problems:   Foot laceration   Discharge Diagnoses:  Active Problems:   Foot laceration  status post Procedure(s): IRRIGATION AND DEBRIDEMENT EXTREMITY, Closure of wound  Past Medical History:  Diagnosis Date  . Diabetes mellitus without complication (HCC)   . Hypertension     Surgeries: Procedure(s): IRRIGATION AND DEBRIDEMENT EXTREMITY, Closure of wound on 05/25/2016 - 05/26/2016   Consultants:   Discharged Condition: Improved  Hospital Course: Renee PollackSandra Lagos Cohen is an 30 y.o. female who was admitted 05/25/2016 for operative treatment of foot laceration. Patient failed conservative treatments (please see the history and physical for the specifics) and had severe unremitting pain that affects sleep, daily activities and work/hobbies. After pre-op clearance, the patient was taken to the operating room on 05/25/2016 - 05/26/2016 and underwent  Procedure(s): IRRIGATION AND DEBRIDEMENT EXTREMITY, Closure of wound.    Patient was given perioperative antibiotics:  Anti-infectives    Start     Dose/Rate Route Frequency Ordered Stop   05/26/16 0400  ceFAZolin (ANCEF) IVPB 2g/100 mL premix     2 g 200 mL/hr over 30 Minutes Intravenous Every 6 hours 05/26/16 0219 05/26/16 1609       Patient was given sequential compression devices and early ambulation to prevent DVT.   Patient benefited maximally from hospital stay and there were no complications. At the time of discharge, the patient was urinating/moving their bowels without difficulty, tolerating a regular diet, pain is controlled with oral pain medications and they have been cleared by PT/OT.   Recent vital signs: No data found.    Recent laboratory studies: No results for input(s): WBC, HGB, HCT, PLT, NA, K, CL, CO2, BUN, CREATININE, GLUCOSE, INR, CALCIUM in the last  72 hours.  Invalid input(s): PT, 2   Discharge Medications:     Medication List    You have not been prescribed any medications.     Diagnostic Studies: Dg Foot Complete Right  Result Date: 05/25/2016 CLINICAL DATA:  Right foot slipped and hit blades while mowing lawn. Initial encounter. EXAM: RIGHT FOOT COMPLETE - 3+ VIEW COMPARISON:  None. FINDINGS: There appear to be minimally displaced fractures involving the medial base of the first distal phalanx, and the proximal aspect of the first proximal phalanx, with overlying soft tissue disruption. Minimal scattered high-density debris is noted tracking about the patient's soft tissue wound. Visualized joint spaces are otherwise preserved. The subtalar joint is grossly unremarkable. IMPRESSION: Minimally displaced fractures involving the medial base of the first distal phalanx, and the proximal aspect of the first proximal phalanx. Minimal high-density debris noted tracking about the patient's soft tissue wound. Electronically Signed   By: Roanna RaiderJeffery  Chang M.D.   On: 05/25/2016 23:09    Discharge Instructions    Call MD / Call 911    Complete by:  As directed    If you experience chest pain or shortness of breath, CALL 911 and be transported to the hospital emergency room.  If you develope a fever above 101.5 F, pus (white drainage) or increased drainage or redness at the wound, or calf pain, call your surgeon's office.   Constipation Prevention    Complete by:  As directed    Drink plenty of fluids.  Prune juice may be helpful.  You may use a stool softener, such as Colace (over the counter) 100 mg twice a day.  Use  MiraLax (over the counter) for constipation as needed.   Diet - low sodium heart healthy    Complete by:  As directed    Diet general    Complete by:  As directed    Driving restrictions    Complete by:  As directed    No driving while taking narcotic pain meds.   Increase activity slowly as tolerated    Complete by:  As  directed       Follow-up Information    Eldred Manges, MD Follow up in 1 week(s).   Specialty:  Orthopedic Surgery Contact information: [address] [place] [address] [phone]           Discharge Plan:  discharge to home  Disposition:     Signed: Naida Sleight for mark yates md 06/18/2016, 10:38 AM

## 2016-06-19 ENCOUNTER — Ambulatory Visit (INDEPENDENT_AMBULATORY_CARE_PROVIDER_SITE_OTHER): Payer: Self-pay | Admitting: Orthopaedic Surgery

## 2016-06-19 DIAGNOSIS — S92421D Displaced fracture of distal phalanx of right great toe, subsequent encounter for fracture with routine healing: Secondary | ICD-10-CM

## 2016-06-19 DIAGNOSIS — S91321D Laceration with foreign body, right foot, subsequent encounter: Secondary | ICD-10-CM

## 2016-06-19 DIAGNOSIS — S92411D Displaced fracture of proximal phalanx of right great toe, subsequent encounter for fracture with routine healing: Secondary | ICD-10-CM

## 2016-06-30 ENCOUNTER — Encounter (INDEPENDENT_AMBULATORY_CARE_PROVIDER_SITE_OTHER): Payer: Self-pay | Admitting: Orthopaedic Surgery

## 2016-06-30 ENCOUNTER — Ambulatory Visit (INDEPENDENT_AMBULATORY_CARE_PROVIDER_SITE_OTHER): Payer: Self-pay | Admitting: Orthopaedic Surgery

## 2016-06-30 VITALS — BP 119/70 | HR 95 | Ht 67.0 in | Wt 105.0 lb

## 2016-06-30 DIAGNOSIS — S91311D Laceration without foreign body, right foot, subsequent encounter: Secondary | ICD-10-CM

## 2016-06-30 NOTE — Progress Notes (Signed)
Office Visit Note   Patient: Renee Cohen           Date of Birth: 01/26/1986           MRN: [id number] Visit Date: 06/30/2016              Requested by: Verdell Faceochelle D. Muse, PA-C [address] [place], [place] [address] PCP: MUSE,ROCHELLE D., PA-C   Assessment & Plan: Visit Diagnoses:  1. Laceration of right foot, subsequent encounter     Plan: Sutures harvested. She can wash foot with soap water lotion gradually work on increasing weightbearing week wean herself off of the crutches. 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   Orders:  No orders of the defined types were placed in this encounter.  No orders of the defined types were placed in this encounter.     Procedures: No procedures performed   Clinical Data: No additional findings.   Subjective: Chief Complaint  Patient presents with  . Right Foot - Follow-up    Patient returns for possible suture removal. She is status post Sharp Excisional Debridement of 7cm laceration and closure on 05/27/16.  She is 4 weeks and 6 days post op. She ambulates on crutches and is nonweightbearing.  The incision looks good. Sutures intact. No drainage or redness. Patient has been washing her foot as instructed. She denies any pain.    Review of Systems No change  Objective: Vital Signs: BP 119/70   Pulse 95   Ht 5\' 7"  (1.702 m)   Wt 105 lb (47.6 kg)   BMI 16.45 kg/m   Physical Exam  Constitutional: She is oriented to person, place, and time. She appears well-developed.  HENT:  Head: Normocephalic.  Right Ear: External ear normal.  Left Ear: External ear normal.  Eyes: Pupils are equal, round, and reactive to light.  Neck: No tracheal deviation present. No thyromegaly present.  Cardiovascular: Normal rate.   Pulmonary/Chest: Effort normal.  Abdominal: Soft.    Neurological: She is alert and oriented to person, place, and time.  Skin: Skin is warm and dry.  Psychiatric: She has a normal mood and affect. Her behavior is normal.    Ortho Exam stellate laceration is healed. No drainage. She has some callus and then skin adjacent to the area where the laceration was closed. She is peeled some of the dead skin off. Sutures are harvested today  Specialty Comments:  No specialty comments available.  Imaging: No results found.   PMFS History: Patient Active Problem List   Diagnosis Date Noted  . Foot laceration 05/26/2016   Past Medical History:  Diagnosis Date  . Diabetes mellitus without complication (HCC)   . Hypertension     No family history on file.  Past Surgical History:  Procedure Laterality Date  . I&D EXTREMITY Right 05/25/2016   Procedure: IRRIGATION AND DEBRIDEMENT EXTREMITY, Closure of wound;  Surgeon: Eldred MangesMark C Eulises Kijowski, MD;  Location: [facility] OR;  Service: Orthopedics;  Laterality: Right;  . IRRIGATION AND DEBRIDEMENT FOOT Right 05/25/2016   Social History   Occupational History  . Not on file.   Social History Main Topics  . Smoking status: Current Every Day Smoker    Packs/day: 0.50    Years: 5.00    Types: Cigarettes  . Smokeless tobacco: Never Used  . Alcohol use No  .  Drug use: No  . Sexual activity: Not on file       

## 2017-07-13 ENCOUNTER — Other Ambulatory Visit (HOSPITAL_COMMUNITY)
Admission: RE | Admit: 2017-07-13 | Discharge: 2017-07-13 | Disposition: A | Discharge status: Home / Self Care | Payer: Self-pay | Source: Ambulatory Visit | Attending: Nurse Practitioner | Admitting: Nurse Practitioner

## 2017-07-13 DIAGNOSIS — R87610 Atypical squamous cells of undetermined significance on cytologic smear of cervix (ASC-US): Secondary | ICD-10-CM | POA: Insufficient documentation

## 2017-07-13 DIAGNOSIS — R87810 Cervical high risk human papillomavirus (HPV) DNA test positive: Secondary | ICD-10-CM | POA: Insufficient documentation

## 2017-10-10 IMAGING — DX DG FOOT COMPLETE 3+V*R*
3 series · 3 of 3 positions shown · non-contrast
Comparison: None.

CLINICAL DATA: Right foot slipped and hit blades while mowing lawn.
Initial encounter.

EXAM:
RIGHT FOOT COMPLETE - 3+ VIEW

[foot ap]
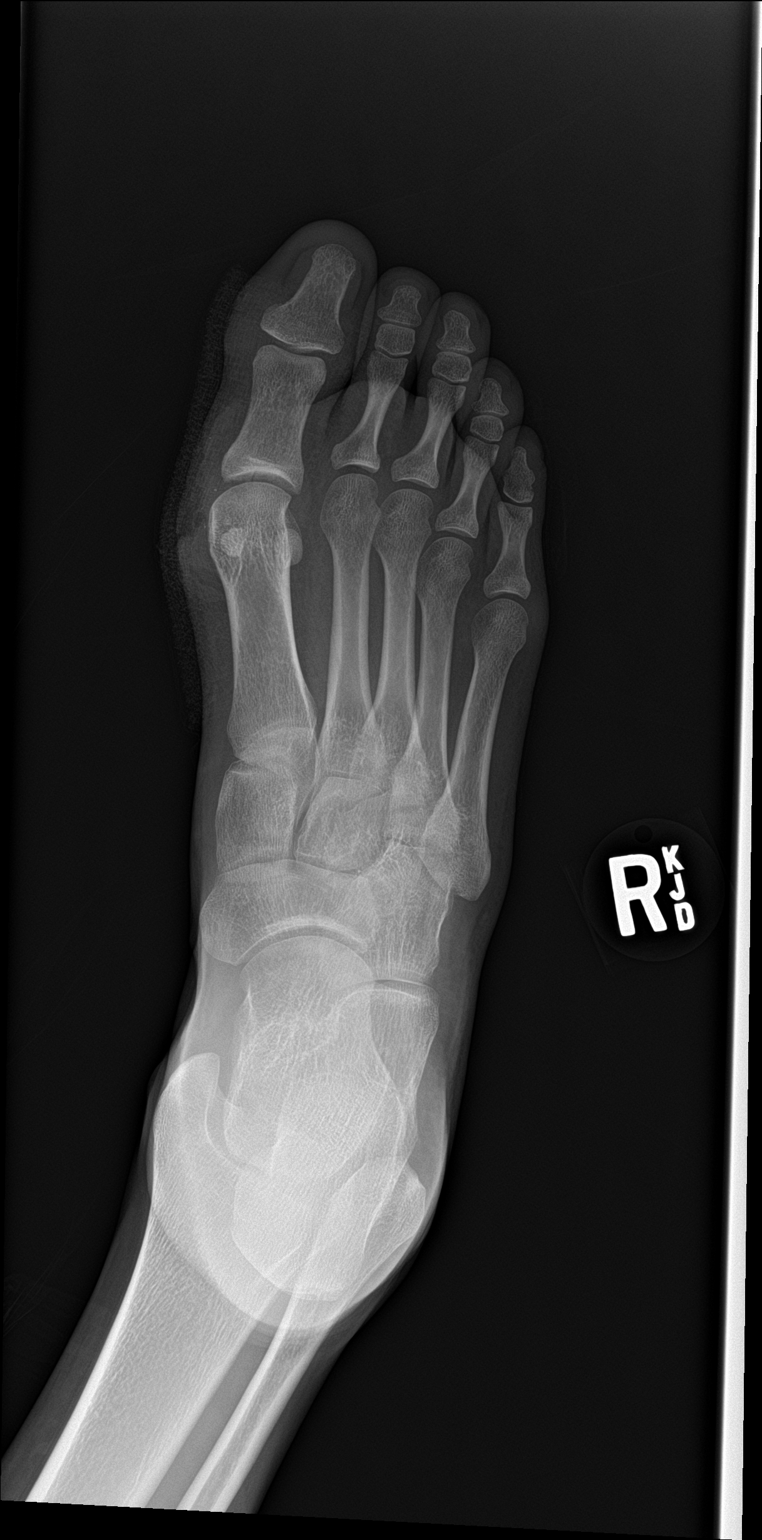

[foot obl]
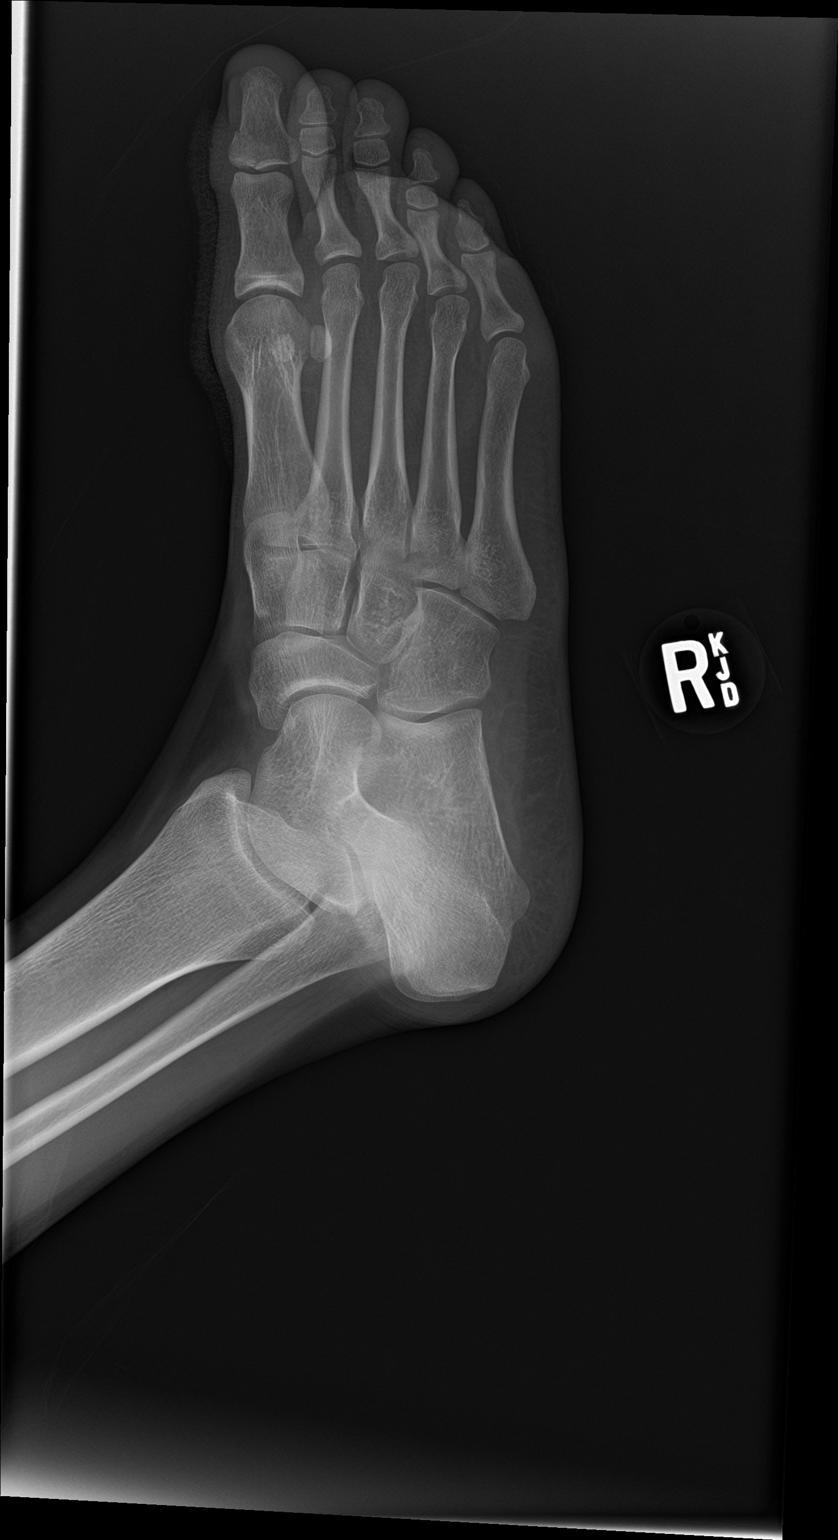

[foot lat]
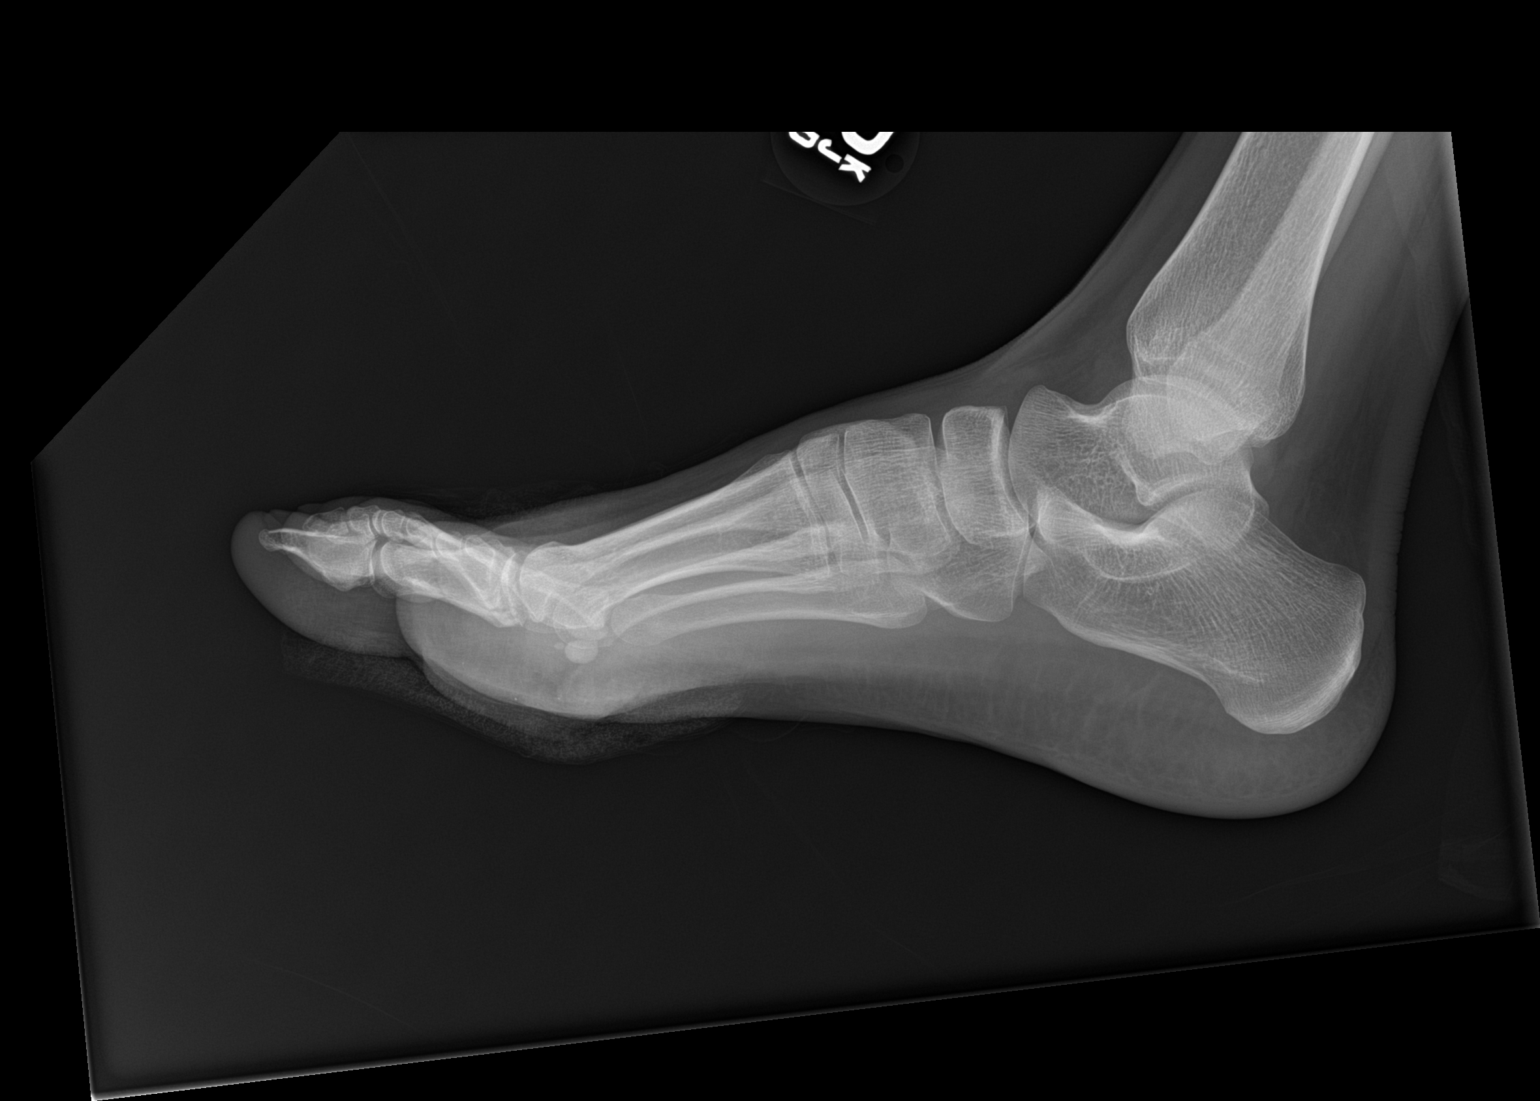

[3 of 3 positions shown; findings below may reference images not displayed]

FINDINGS: There appear to be minimally displaced fractures involving the
medial base of the first distal phalanx, and the proximal aspect of
the first proximal phalanx, with overlying soft tissue disruption.
Minimal scattered high-density debris is noted tracking about the
patient's soft tissue wound.

Visualized joint spaces are otherwise preserved. The subtalar joint
is grossly unremarkable.
IMPRESSION: Minimally displaced fractures involving the medial base of the first
distal phalanx, and the proximal aspect of the first proximal
phalanx. Minimal high-density debris noted tracking about the
patient's soft tissue wound.
